# Patient Record
Sex: Female | Born: 1970 | Race: White | Hispanic: No | Marital: Married
Health system: Southern US, Community
[De-identification: ages and names within clinical notes are randomized; demographics above are authoritative.]

---

## 2007-03-23 ENCOUNTER — Ambulatory Visit (HOSPITAL_COMMUNITY): Admission: RE | Admit: 2007-03-23 | Discharge: 2007-03-23 | Payer: Self-pay | Admitting: Urology

## 2007-07-13 ENCOUNTER — Ambulatory Visit (HOSPITAL_COMMUNITY): Admission: RE | Admit: 2007-07-13 | Discharge: 2007-07-13 | Payer: Self-pay | Admitting: Urology

## 2007-09-20 ENCOUNTER — Ambulatory Visit (HOSPITAL_COMMUNITY): Admission: RE | Admit: 2007-09-20 | Discharge: 2007-09-21 | Payer: Self-pay | Admitting: Urology

## 2007-10-16 ENCOUNTER — Ambulatory Visit (HOSPITAL_BASED_OUTPATIENT_CLINIC_OR_DEPARTMENT_OTHER): Admission: RE | Admit: 2007-10-16 | Discharge: 2007-10-16 | Payer: Self-pay | Admitting: Urology

## 2007-10-18 ENCOUNTER — Ambulatory Visit (HOSPITAL_COMMUNITY): Admission: RE | Admit: 2007-10-18 | Discharge: 2007-10-18 | Payer: Self-pay | Admitting: Urology

## 2008-05-03 IMAGING — NM NM RENAL IMAGING FLOW W/ PHARM
2 series · 12 of 12 positions shown · non-contrast
Comparison: None

CLINICAL DATA: Right flank pain.

NM RENAL SCAN WITH DIURETIC ADMINISTRATION
TECHNIQUE: Radionuclide angiographic and sequential renal images were obtained
after intravenous injection of radiopharmaceutical.  Imaging was continued
during slow intravenous injection of Lasix approximately 20-30 minutes after the
start of the examination. 
Radiopharmaceutical:  13.1 mCi 9c-XXm MAG3 and 32 mg Lasix

[Series 1: re renal qualitative · 9.51mm/px · 6 of 130 frames shown (1 of 2)]
[frame 11/130]
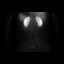
[frame 33/130]
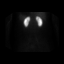
[frame 55/130]
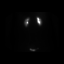
[frame 76/130]
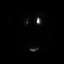
[frame 98/130]
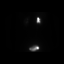
[frame 120/130]
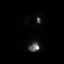

[Series 1: re renal qualitative · 9.51mm/px · 6 of 130 frames shown (2 of 2)]
[frame 11/130]
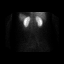
[frame 33/130]
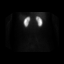
[frame 55/130]
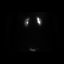
[frame 76/130]
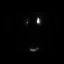
[frame 98/130]
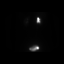
[frame 120/130]
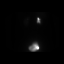

[12 of 12 positions shown; findings below may reference images not displayed]

FINDINGS: Flow phase images show prompt and symmetric perfusion to both
kidneys.

The parenchymal phase images shows symmetric cortical uptake by both kidneys.
Both kidneys are normal in size, shape, location and contour.

The split renal function is equal to 49.5% from the left kidney and 50.5% from
the right kidney.

Delayed phase images show symmetric excretion of the radiotracer by both
kidneys.

Following the intravenous administration of Lasix there is prompt clearance from
the left renal collecting system. Persistent and delayed clearance of
radiotracer from a dilated right renal collecting system is noted.

IMPRESSION

1. Normal flow, cortical uptake, excretion and clearance by the left kidney.
2.  There is normal flow, cortical uptake, and excretion of radiotracer by the
right kidney. However, there is delayed and decreased clearance of radiotracer
from a dilated right renal collecting system. Findings are consistent with
hydronephrosis without high-grade obstructive uropathy.

## 2010-08-02 ENCOUNTER — Encounter: Payer: Self-pay | Admitting: Urology

## 2010-11-23 NOTE — Op Note (Signed)
[name], [name]             ACCOUNT NO.:  [id number]   MEDICAL RECORD NO.:  [id number]          PATIENT TYPE:  OIB   LOCATION:  1428                         FACILITY:  [facility]   PHYSICIAN:  Jamison Neighbor, M.D.  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   PROCEDURE:  System right retrograde and right double J catheter  exchange.   SURGEON:  Jamison Neighbor, M.D.   ANESTHESIA:  General.   COMPLICATIONS:  None.   DRAINS:  An 8 French x 24 double J catheter.   HISTORY:  This 40 year old female has had problems with chronic pain on  the right-hand side.  Imaging studies could never successfully  demonstrate a UPJ obstruction or any other form of obstruction but the  patient had had significant colic like pain on the left.  The patient  underwent stent placement to determine if that might alleviate her pain  and she had complete resolution of her symptoms.  She has tolerated the  stent without difficulty and denied any significant urgency or frequency  and this has allowed her to get off all of her pain medication.   A couple of days ago the patient began to develop pain and she was noted  to have mild hydronephrosis on the right-hand side.  This certainly  suggested that the patient's stent may be bothering her.  I stressed to  her that the mild hydronephrosis is pretty unremarkable given the fact  that she has had longstanding pain and has the stent in place.  She is  now to undergo stent exchange to determine if that will alleviate her  pain.  She understands the risks and benefits of the procedure.  Full  informed consent was obtained.   PROCEDURE IN DETAIL:  After successful induction of general  anesthesia  the patient was placed in the dorsal lithotomy position and prepped with  Betadine and draped in the usual sterile fashion.  Careful bimanual  examination revealed no irregularities of the urethra or bladder base.  Cystoscope was inserted.  The bladder was carefully inspected.  There  were mucosal abnormalities secondary to the stent placement but  otherwise no irregularities were seen.  The grasper was used to  partially pull the ureteral stent out from the meatus.  A guidewire was  passed up to the kidney and the stent was removed.  A 6 French open-  ended catheter was then passed over the guidewire into the kidney and  the wire was removed.  The retrograde study performed through this 6  French ureteral catheter showed a normal appearing collecting system  other than the fact that it was mildly dilated secondary to the previous  stent placement.  There was no evidence of a filling defect.  There was  not what I would consider a classic UPJ obstruction.  The guidewire was  placed  back up within the kidney and the wire was back loaded into the  cystoscope.  Using fluoroscopic and cystoscopic control an 8 French catheter was  passed up into the kidney and allowed to coil normally in the renal  pelvis as well as within the bladder.  The bladder was drained.  The  patient tolerated the procedure well and was taken to recovery in good  condition.      Jamison Neighbor, M.D.  Electronically Signed     RJE/MEDQ  D:  09/20/2007  T:  09/21/2007  Job:  [id number]

## 2010-11-23 NOTE — Op Note (Signed)
NAMEKIMIYA, [name]             ACCOUNT NO.:  [id number]   MEDICAL RECORD NO.:  [id number]          PATIENT TYPE:  AMB   LOCATION:  [facility]                         FACILITY:  [facility]   PHYSICIAN:  Jamison Neighbor, M.D.  DATE OF BIRTH:  December 27, 1970   DATE OF PROCEDURE:  10/16/2007  DATE OF DISCHARGE:                               OPERATIVE REPORT   SERVICE:  Urology.   PREOPERATIVE DIAGNOSIS:  Chronic right renal pain.   SECONDARY DIAGNOSIS:  Painful 8-French ureteral catheter.   POSTOPERATIVE DIAGNOSIS:  Chronic right renal pain.   PROCEDURE:  Cystoscopy, right retrograde ureteropyelogram and exchange  of 8-French ureteral catheter for 6-French ureteral catheter.   SURGEON:  Dr. Marcelyn Bruins.   ANESTHESIA:  General.   COMPLICATIONS:  None.   DRAINS:  6-French x 24-cm double-J catheter.   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 She has had longstanding problems with pain in the  right-hand side.  She has classic symptoms consistent with a UPJ  obstruction but numerous imaging studies have never demonstrated finding  that would explain her pain.  The patient was desperate and requested  that a double-J catheter be placed. A 6-French catheter was inserted,  even though the retrograde study done at that time of the surgery was  normal and the patient did quite well in terms of pain relief.  After  several months, however, the stent began to give her problems and did  not appear to be as draining as well so a decision was made to proceed  with a stent replacement.  At the time of the last stent change, the  patient asked that a larger stent be placed with the hope that this  would decrease her chronic pain.   The 8-French catheter was inserted without difficulty but the patient  has had terrible problems since that time. It is unclear whether this is  simply due to the fact that the stent is larger and perhaps a little  stiffer or  if there is some other problem but the patient has had no  pain relief.  She has required increasing amounts of pain management and  has continued to request the scheduled two narcotic medications for  relief of her pain.   The patient is now to undergo removal of the previously placed 8-French  catheter with insertion of a 6-French catheter identical to the one she  had when she first had the stent placed.  Hopefully this will eliminate  her pain and she will be able to manage this for 6 months. Ideally we  would like to change the stent every 3-6 months.  If it turns out that  with this new stent in place the patient has no relief, I have point-  blank told her that there is nothing further that I know to do and that  I would have no further solutions for her chronic pain. At this point I  have considered the possibilities of an ileal ureter but without some  form of confirmation that the patient's pain will be alleviated, I  would  not be in favor of this. I am also not in favor of nephrectomy of what  appears to be an otherwise completely normal kidney.  The patient has  agreed to cysto removal of the 8-French catheter, placement of a new 6-  French catheter and retrograde studies as part of the evaluation.  She  understands the risks and benefits as described above and gave full  informed consent.   PROCEDURE:  After successful induction of general anesthesia, the  patient was placed in the dorsal lithotomy position, prepped with  Betadine and draped in the usual sterile fashion.  Cystoscopy was  performed, the cystoscope was inserted.  The bladder was carefully  inspected and no tumors or stones could be seen.  The left ureteral  orifice was normal in configuration and location. The right ureteral  orifice had a double-J exiting it. There was the usual edematous changes  around the ureter and the ureter itself was wide open having been  somewhat stretched open by the chronic stent  placement.  The stent was  grasped and partially pulled out of the urethra. A guidewire was then  passed through the stent and allowed to enter the renal pelvis.  A  ureteral catheter was then inserted over the guidewire and advanced up  to the renal pelvis. A retrograde study that was performed through that  study showed a finely formed collecting system with no evidence of  blunting. The collecting system itself was small, a few inadvertent air  bubbles were identified but no filling defects could be seen. The upper  pole was free of dilation, although it does appear that the stent may  have resided at one point in the upper pole portion of the collecting  system.  The new guidewire was placed up through the ureteral catheter  which was then withdrawn. The scope was back loaded over the wire.  The  new double-J catheter was then passed over the wire and allowed to coil  in the collecting itself with no stent within the upper pole and also  coiled normally within the bladder.  The bladder was drained.  The  patient tolerated the procedure well and was taken to the recovery room  in good condition.  She will be given every possible therapy to help her  with her bladder.  This will occlude B&O suppositories, Levsin  sublingual, Pyridium Plus as well as pain medication.  The patient knows  that we will not be continuing any heavy duty narcotics and at minimum  we will certainly not give her any scheduled two narcotics or any more  B&O suppositories and long-term, I do not intend to have her be on even  scheduled three narcotics for this problem.  If she has persistent pain  that cannot be treated other considerations up to including nephrectomy  will have to be considered.      Jamison Neighbor, M.D.  Electronically Signed     RJE/MEDQ  D:  10/16/2007  T:  10/16/2007  Job:  [id number]

## 2010-11-23 NOTE — Op Note (Signed)
[name], [name]             ACCOUNT NO.:  [id number]   MEDICAL RECORD NO.:  [id number]          PATIENT TYPE:  AMB   LOCATION:  DAY                          FACILITY:  [facility]   PHYSICIAN:  Jamison Neighbor, M.D.  DATE OF BIRTH:  1970-09-30   DATE OF PROCEDURE:  07/13/2007  DATE OF DISCHARGE:                               OPERATIVE REPORT   PREOPERATIVE DIAGNOSIS:  Chronic right flank pain.   POSTOPERATIVE DIAGNOSIS:  Chronic right flank pain.   PROCEDURE:  Cystoscopy, right retrograde with interpretation, right  rigid and flexible ureteroscopy, right double-J catheter insertion.   SURGEON:  Jamison Neighbor, M.D.   ANESTHESIA:  General.   COMPLICATIONS:  None.   DRAINS:  6-French x 24-cm double-J catheter.   BRIEF HISTORY:  This 40 year old female has had chronic right sided  flank pain.  She has had previous retrograde studies as well as imaging  studies and no one has never been able to detect the source for her  chronic right flank pain.  The patient was recently told that she had a  stone when she had ultrasound study done elsewhere.  It should be noted  that multiple imaging studies including CT scans have really never  demonstrated stone disease.  Because the patient's pain is severe, it  has not responded to nerve blocks etc.  She was placed on dilated from  pain clinic.  She is interested in getting off pain medicine and wants  to find out if there is anything that could alleviate her pain.  She is  now to undergo diagnostic retrograde studies ureteroscopy and stent  placement.  She notes that there is no improvement with the stent and it  will be considered definitive proof that the patient's pain is not due  to the ureteral obstruction and that her pain is likely not neurologic  in nature.  If on the other hand, she does have a significant decrease  in pain that would suggest that she has some type of obstruction and  consideration will be given to either serial  double-J catheters,  pyeloplasty, ileal ureter, etc. the patient is now to undergo retrograde  and ureteroscopy with stent placement.  She understands the risks and  benefits of the procedure and gave full informed consent.   PROCEDURE:  After successful induction of general anesthesia the patient  was placed in the dorsal lithotomy position, prepped with Betadine and  draped in the usual sterile fashion.  Careful bimanual examination  revealed unremarkable bladder base with no signs of the cystocele.  There was no rectocele and no signs of diverticulum.  The cystoscope was  inserted.  The bladder was carefully inspected.  No tumors or stones  could be seen.  Both ureteral orifices were identified and were normal  in position.  They were actually noted to be somewhat larger than  average and there was no evidence whatsoever of stenosis.  A retrograde  study was performed on the right-hand side.  A 6-French ureteral  catheter was inserted and contrast was injected.   During the retrograde study, contrast was seen to  ascend a normal  caliber ureter.  There was no evidence of a filling defect within the  ureter.  The collecting system appeared to be unobstructed.  There was  no kinking at the UPJ and the caliceal system appeared to be finely  formed.  As extra contrast was instilled there was a little bit of  dilation, but I think this was an artifact from the retrograde study  itself.  There did not appear to be any pathology detected on that  study.  A guidewire was then placed up into the kidney and ureteral  catheter was removed.   Following completion of retrograde pyelogram, a rigid ureteroscope was  inserted alongside the ureter.  This was passed all way up to the UPJ.  There was no evidence of any obstruction but the short ureteroscope  would not reach all the way into the pelvis.  A proximal ureteroscope  was then advanced over the guide wire which was temporarily removed.  The  entire collecting system was inspected and no irregularities could  be identified and certainly nothing causing obstruction.  The guidewire  was replaced.  The flexible ureteroscope was removed.  The cystoscope  was back loaded over the guide wire.  A double-J catheter was then  passed up to the kidney using fluoroscopic and cystoscopic control.  The  bladder was drained.  The patient tolerated procedure well, was taken to  recovery in good condition.  She will be sent home with her existing  pain medication along with Ditropan for any spasms, Pyridium Plus for  burning and will be covered with Macrodantin one daily until the stent  is removed.      Jamison Neighbor, M.D.  Electronically Signed     RJE/MEDQ  D:  07/13/2007  T:  07/13/2007  Job:  [id number]

## 2011-03-31 LAB — PREGNANCY, URINE: Preg Test, Ur: NEGATIVE

## 2011-04-04 LAB — HEMOGLOBIN AND HEMATOCRIT, BLOOD: HCT: 33.7 — ABNORMAL LOW

## 2011-04-04 LAB — PREGNANCY, URINE: Preg Test, Ur: NEGATIVE

## 2011-04-05 LAB — POCT PREGNANCY, URINE
Operator id: 280881
Preg Test, Ur: NEGATIVE

## 2011-04-05 LAB — POCT HEMOGLOBIN-HEMACUE: Operator id: 280881

## 2015-04-12 ENCOUNTER — Telehealth: Payer: Self-pay | Admitting: Radiology

## 2015-04-12 NOTE — Telephone Encounter (Signed)
Error

## 2015-04-12 NOTE — Telephone Encounter (Signed)
Error incorrect chart
